# Patient Record
Sex: Male | Born: 1960 | Race: White | Hispanic: No | Marital: Single
Health system: Southern US, Community
[De-identification: ages and names within clinical notes are randomized; demographics above are authoritative.]

## PROBLEM LIST (undated history)

## (undated) DIAGNOSIS — I10 Essential (primary) hypertension: Secondary | ICD-10-CM

## (undated) DIAGNOSIS — M549 Dorsalgia, unspecified: Secondary | ICD-10-CM

## (undated) DIAGNOSIS — K219 Gastro-esophageal reflux disease without esophagitis: Secondary | ICD-10-CM

## (undated) DIAGNOSIS — F319 Bipolar disorder, unspecified: Secondary | ICD-10-CM

## (undated) DIAGNOSIS — F419 Anxiety disorder, unspecified: Secondary | ICD-10-CM

## (undated) DIAGNOSIS — K5790 Diverticulosis of intestine, part unspecified, without perforation or abscess without bleeding: Secondary | ICD-10-CM

## (undated) DIAGNOSIS — I4891 Unspecified atrial fibrillation: Secondary | ICD-10-CM

---

## 2019-02-23 ENCOUNTER — Emergency Department (HOSPITAL_COMMUNITY)
Admission: EM | Admit: 2019-02-23 | Discharge: 2019-02-23 | Disposition: A | Discharge status: Home / Self Care | Payer: No Typology Code available for payment source | Attending: Emergency Medicine | Admitting: Emergency Medicine

## 2019-02-23 ENCOUNTER — Emergency Department (HOSPITAL_COMMUNITY): Payer: No Typology Code available for payment source

## 2019-02-23 ENCOUNTER — Encounter (HOSPITAL_COMMUNITY): Payer: Self-pay | Admitting: Emergency Medicine

## 2019-02-23 ENCOUNTER — Other Ambulatory Visit: Payer: Self-pay

## 2019-02-23 DIAGNOSIS — J069 Acute upper respiratory infection, unspecified: Secondary | ICD-10-CM | POA: Insufficient documentation

## 2019-02-23 DIAGNOSIS — I10 Essential (primary) hypertension: Secondary | ICD-10-CM | POA: Insufficient documentation

## 2019-02-23 DIAGNOSIS — R0789 Other chest pain: Secondary | ICD-10-CM

## 2019-02-23 DIAGNOSIS — R079 Chest pain, unspecified: Secondary | ICD-10-CM | POA: Diagnosis present

## 2019-02-23 DIAGNOSIS — B9789 Other viral agents as the cause of diseases classified elsewhere: Secondary | ICD-10-CM

## 2019-02-23 HISTORY — DX: Unspecified atrial fibrillation: I48.91

## 2019-02-23 HISTORY — DX: Gastro-esophageal reflux disease without esophagitis: K21.9

## 2019-02-23 HISTORY — DX: Bipolar disorder, unspecified: F31.9

## 2019-02-23 HISTORY — DX: Essential (primary) hypertension: I10

## 2019-02-23 HISTORY — DX: Diverticulosis of intestine, part unspecified, without perforation or abscess without bleeding: K57.90

## 2019-02-23 HISTORY — DX: Anxiety disorder, unspecified: F41.9

## 2019-02-23 HISTORY — DX: Dorsalgia, unspecified: M54.9

## 2019-02-23 LAB — BASIC METABOLIC PANEL
Anion gap: 10 (ref 5–15)
BUN: 18 mg/dL (ref 6–20)
CO2: 23 mmol/L (ref 22–32)
Calcium: 9.4 mg/dL (ref 8.9–10.3)
Chloride: 103 mmol/L (ref 98–111)
Creatinine, Ser: 1.23 mg/dL (ref 0.61–1.24)
GFR calc Af Amer: >60 mL/min (ref >60)
GFR calc non Af Amer: >60 mL/min (ref >60)
Glucose, Bld: 122 mg/dL — ABNORMAL HIGH (ref 70–99)
Potassium: 4.1 mmol/L (ref 3.5–5.1)
Sodium: 136 mmol/L (ref 135–145)

## 2019-02-23 LAB — CBC WITH DIFFERENTIAL/PLATELET
Abs Immature Granulocytes: 0.03 10*3/uL (ref 0.00–0.07)
Basophils Absolute: 0.1 10*3/uL (ref 0.0–0.1)
Basophils Relative: 1 %
Eosinophils Absolute: 0.1 10*3/uL (ref 0.0–0.5)
Eosinophils Relative: 2 %
HCT: 46.8 % (ref 39.0–52.0)
Hemoglobin: 16 g/dL (ref 13.0–17.0)
Immature Granulocytes: 0 %
Lymphocytes Relative: 27 %
Lymphs Abs: 2.1 10*3/uL (ref 0.7–4.0)
MCH: 30.6 pg (ref 26.0–34.0)
MCHC: 34.2 g/dL (ref 30.0–36.0)
MCV: 89.5 fL (ref 80.0–100.0)
Monocytes Absolute: 0.6 10*3/uL (ref 0.1–1.0)
Monocytes Relative: 8 %
Neutro Abs: 4.7 10*3/uL (ref 1.7–7.7)
Neutrophils Relative %: 62 %
Platelets: 290 10*3/uL (ref 150–400)
RBC: 5.23 MIL/uL (ref 4.22–5.81)
RDW: 12.6 % (ref 11.5–15.5)
WBC: 7.6 10*3/uL (ref 4.0–10.5)
nRBC: 0 % (ref 0.0–0.2)

## 2019-02-23 LAB — TROPONIN I: Troponin I: <0.03 ng/mL (ref <0.03)

## 2019-02-23 NOTE — ED Triage Notes (Signed)
C/o fever of 100, chest pain and pressure "like 100# is sitting on my chest" started Saturday-- [facility] , [facility] med[facility] (zithromax and guiafenisin). Pain continued today and was told to come here.

## 2019-02-23 NOTE — Discharge Instructions (Addendum)
Person Under Monitoring Name: Scott Ellison  Location: [address] [place] [address]   Infection Prevention Recommendations for Individuals Confirmed to have, or Being Evaluated for, 2019 Novel Coronavirus (COVID-19) Infection Who Receive Care at Home  Individuals who are confirmed to have, or are being evaluated for, COVID-19 should follow the prevention steps below until a healthcare provider or local or state health department says they can return to normal activities.  Stay home except to get medical care You should restrict activities outside your home, except for getting medical care. Do not go to work, school, or public areas, and do not use public transportation or taxis.  Call ahead before visiting your doctor Before your medical appointment, call the healthcare provider and tell them that you have, or are being evaluated for, COVID-19 infection. This will help the healthcare providers office take steps to keep other people from getting infected. Ask your healthcare provider to call the local or state health department.  Monitor your symptoms Seek prompt medical attention if your illness is worsening (e.g., difficulty breathing). Before going to your medical appointment, call the healthcare provider and tell them that you have, or are being evaluated for, COVID-19 infection. Ask your healthcare provider to call the local or state health department.  Wear a facemask You should wear a facemask that covers your nose and mouth when you are in the same room with other people and when you visit a healthcare provider. People who live with or visit you should also wear a facemask while they are in the same room with you.  Separate yourself from other people in your home As much as possible, you should stay in a different room from other people in your home. Also, you should use a separate bathroom, if available.  Avoid sharing household items You should not  share dishes, drinking glasses, cups, eating utensils, towels, bedding, or other items with other people in your home. After using these items, you should wash them thoroughly with soap and water.  Cover your coughs and sneezes Cover your mouth and nose with a tissue when you cough or sneeze, or you can cough or sneeze into your sleeve. Throw used tissues in a lined trash can, and immediately wash your hands with soap and water for at least 20 seconds or use an alcohol-based hand rub.  Wash your [employer] your hands often and thoroughly with soap and water for at least 20 seconds. You can use an alcohol-based hand sanitizer if soap and water are not available and if your hands are not visibly dirty. Avoid touching your eyes, nose, and mouth with unwashed hands.   Prevention Steps for Caregivers and Household Members of Individuals Confirmed to have, or Being Evaluated for, COVID-19 Infection Being Cared for in the Home  If you live with, or provide care at home for, a person confirmed to have, or being evaluated for, COVID-19 infection please follow these guidelines to prevent infection:  Follow healthcare providers instructions Make sure that you understand and can help the patient follow any healthcare provider instructions for all care.  Provide for the patients basic needs You should help the patient with basic needs in the home and provide support for getting groceries, prescriptions, and other personal needs.  Monitor the patients symptoms If they are getting sicker, call his or her medical provider and tell them that the patient has, or is being evaluated for, COVID-19 infection. This will help the healthcare providers  office take steps to keep other people from getting infected. Ask the healthcare provider to call the local or state health department.  Limit the number of people who have contact with the patient If possible, have only one caregiver for the  patient. Other household members should stay in another home or place of residence. If this is not possible, they should stay in another room, or be separated from the patient as much as possible. Use a separate bathroom, if available. Restrict visitors who do not have an essential need to be in the home.  Keep older adults, very young children, and other sick people away from the patient Keep older adults, very young children, and those who have compromised immune systems or chronic health conditions away from the patient. This includes people with chronic heart, lung, or kidney conditions, diabetes, and cancer.  Ensure good ventilation Make sure that shared spaces in the home have good air flow, such as from an air conditioner or an opened window, weather permitting.  Wash your hands often Wash your hands often and thoroughly with soap and water for at least 20 seconds. You can use an alcohol based hand sanitizer if soap and water are not available and if your hands are not visibly dirty. Avoid touching your eyes, nose, and mouth with unwashed hands. Use disposable paper towels to dry your hands. If not available, use dedicated cloth towels and replace them when they become wet.  Wear a facemask and gloves Wear a disposable facemask at all times in the room and gloves when you touch or have contact with the patients blood, body fluids, and/or secretions or excretions, such as sweat, saliva, sputum, nasal mucus, vomit, urine, or feces.  Ensure the mask fits over your nose and mouth tightly, and do not touch it during use. Throw out disposable facemasks and gloves after using them. Do not reuse. Wash your hands immediately after removing your facemask and gloves. If your personal clothing becomes contaminated, carefully remove clothing and launder. Wash your hands after handling contaminated clothing. Place all used disposable facemasks, gloves, and other waste in a lined container before  disposing them with other household waste. Remove gloves and wash your hands immediately after handling these items.  Do not share dishes, glasses, or other household items with the patient Avoid sharing household items. You should not share dishes, drinking glasses, cups, eating utensils, towels, bedding, or other items with a patient who is confirmed to have, or being evaluated for, COVID-19 infection. After the person uses these items, you should wash them thoroughly with soap and water.  Wash laundry thoroughly Immediately remove and wash clothes or bedding that have blood, body fluids, and/or secretions or excretions, such as sweat, saliva, sputum, nasal mucus, vomit, urine, or feces, on them. Wear gloves when handling laundry from the patient. Read and follow directions on labels of laundry or clothing items and detergent. In general, wash and dry with the warmest temperatures recommended on the label.  Clean all areas the individual has used often Clean all touchable surfaces, such as counters, tabletops, doorknobs, bathroom fixtures, toilets, phones, keyboards, tablets, and bedside tables, every day. Also, clean any surfaces that may have blood, body fluids, and/or secretions or excretions on them. Wear gloves when cleaning surfaces the patient has come in contact with. Use a diluted bleach solution (e.g., dilute bleach with 1 part bleach and 10 parts water) or a household disinfectant with a label that says EPA-registered for coronaviruses. To make a  bleach solution at home, add 1 tablespoon of bleach to 1 quart (4 cups) of water. For a larger supply, add  cup of bleach to 1 gallon (16 cups) of water. Read labels of cleaning products and follow recommendations provided on product labels. Labels contain instructions for safe and effective use of the cleaning product including precautions you should take when applying the product, such as wearing gloves or eye protection and making sure you  have good ventilation during use of the product. Remove gloves and wash hands immediately after cleaning.  Monitor yourself for signs and symptoms of illness Caregivers and household members are considered close contacts, should monitor their health, and will be asked to limit movement outside of the home to the extent possible. Follow the monitoring steps for close contacts listed on the symptom monitoring form.   ? If you have additional questions, contact your local health department or call the epidemiologist on call at [phone] (available 24/7). ? This guidance is subject to change. For the most up-to-date guidance from [facility], please refer to their website: [email or web address]

## 2019-02-23 NOTE — ED Provider Notes (Signed)
[facility] EMERGENCY DEPARTMENT Provider Note   CSN: [id number] Arrival date & time: 02/23/19  1005    History   Chief Complaint Chief Complaint  Patient presents with  . Chest Pain  . Cough    HPI Scott Ellison is a 58 y.o. male with past medical history of anxiety, hypertension, GERD, chronic back pain, atrial fibrillation, presenting to the emergency department with complaint of chest pressure and tightness that began this weekend.  Symptoms are constant, nonradiating, described as a "100lb weight" sitting on his chest.  He reports feeling fatigued and winded with exertion however is not short of breath at rest and is having no difficulty breathing.  He has a dry cough. Last week symptoms initially began with scratchy and sore throat on Wednesday.  He has had associated intermittent symptoms consisting of low-grade fever of T-max 100 F, body aches, nausea, diarrhea, loss of appetite.  [facility] provider at [facility] prescribed guaifenesin and a Z-Pak last week, however spoke with them today for follow-up and he described the chest pressure, therefore was recommended report to the ED for further evaluation.  Patient's girlfriend works in the Dealer at [employer].  No cardiac history.  No history of immunocompromise.     The history is provided by the patient.    Past Medical History:  Diagnosis Date  . Anxiety   . Atrial fibrillation (HCC)   . Back pain   . Bipolar 1 disorder (HCC)   . Diverticulosis   . GERD (gastroesophageal reflux disease)   . Hypertension     There are no active problems to display for this patient.   History reviewed. No pertinent surgical history.      Home Medications    Prior to Admission medications   Not on File    Family History No family history on file.  Social History Social History   Tobacco Use  . Smoking status: Not on file  Substance Use Topics  . Alcohol use: Not on file  . Drug use: Not on file      Allergies   Patient has no allergy information on record.   Review of Systems Review of Systems  Constitutional: Positive for appetite change and fever.  HENT: Positive for sore throat.   Respiratory: Positive for cough and chest tightness.   Cardiovascular: Negative for palpitations.  Gastrointestinal: Positive for diarrhea and nausea. Negative for abdominal pain.  Musculoskeletal: Positive for myalgias (generalized).  Allergic/Immunologic: Negative for immunocompromised state.  All other systems reviewed and are negative.    Physical Exam Updated Vital Signs BP (!) 149/97   Pulse 64   Temp 98.3 F (36.8 C) (Oral)   Resp 14   Ht  (1.651 m)   Wt 90.7 kg   SpO2 99%   BMI 33.28 kg/m   Physical Exam Vitals signs and nursing note reviewed.  Constitutional:      General: He is not in acute distress.    Appearance: He is well-developed. He is obese. He is not ill-appearing.  HENT:     Head: Normocephalic and atraumatic.     Right Ear: Tympanic membrane and ear canal normal.     Left Ear: Tympanic membrane and ear canal normal.     Mouth/Throat:     Mouth: Mucous membranes are moist.     Pharynx: Oropharynx is clear. No oropharyngeal exudate or posterior oropharyngeal erythema.  Eyes:     Conjunctiva/sclera: Conjunctivae normal.  Neck:  Musculoskeletal: Normal range of motion and neck supple. No neck rigidity or muscular tenderness.  Cardiovascular:     Rate and Rhythm: Normal rate and regular rhythm.     Heart sounds: Normal heart sounds.  Pulmonary:     Effort: Pulmonary effort is normal. No respiratory distress.     Breath sounds: Normal breath sounds.  Chest:     Chest wall: No tenderness.  Abdominal:     General: Bowel sounds are normal.     Palpations: Abdomen is soft.     Tenderness: There is no abdominal tenderness. There is no guarding or rebound.  Lymphadenopathy:     Cervical: No cervical adenopathy.  Skin:    General: Skin is warm.   Neurological:     Mental Status: He is alert.  Psychiatric:        Behavior: Behavior normal.      ED Treatments / Results  Labs (all labs ordered are listed, but only abnormal results are displayed) Labs Reviewed  BASIC METABOLIC PANEL - Abnormal; Notable for the following components:      Result Value   Glucose, Bld 122 (*)    All other components within normal limits  CBC WITH DIFFERENTIAL/PLATELET  TROPONIN I    EKG EKG Interpretation  Date/Time:  Monday February 23 2019 10:21:13 EDT Ventricular Rate:  78 PR Interval:    QRS Duration: 99 QT Interval:  394 QTC Calculation: 449 R Axis:   5 Text Interpretation:  Sinus rhythm No old tracing to compare No apparent ischemia Confirmed by Shaune Pollack [phone]) on 02/23/2019 10:52:27 AM   Radiology Dg Chest Port 1 View  Result Date: 02/23/2019 CLINICAL DATA:  Fever, chest pain and pressure. EXAM: PORTABLE CHEST 1 VIEW COMPARISON:  None. FINDINGS: Lung volumes are low but the lungs are clear. Heart size is normal. No pneumothorax or pleural fluid. No acute or focal bony abnormality. IMPRESSION: No acute finding in a low volume chest. Electronically Signed   By: Drusilla Kanner M.D.   On: 02/23/2019 10:56    Procedures Procedures (including critical care time)  Medications Ordered in ED Medications - No data to display   Initial Impression / Assessment and Plan / ED Course  I have reviewed the triage vital signs and the nursing notes.  Pertinent labs & imaging results that were available during my care of the patient were reviewed by me and considered in my medical decision making (see chart for details).    Scott Ellison was evaluated in Emergency Department on 02/23/2019 for the symptoms described in the history of present illness. He was evaluated in the context of the global COVID-19 pandemic, which necessitated consideration that the patient might be at risk for infection with the SARS-CoV-2 virus that causes COVID-19.  Institutional protocols and algorithms that pertain to the evaluation of patients at risk for COVID-19 are in a state of rapid change based on information released by regulatory bodies including the CDC and federal and state organizations. These policies and algorithms were followed during the patient's care in the ED.     Patients symptoms are consistent with URI, likely viral etiology. Chest pressure is suspected to be associated with current viral illness. Doubt ACS, sx are atypical, constant for multiple days. Trop is neg, EKG is nonischemic. Low heart score. Afebrile, tolerating secretions.  Lungs clear to auscultation bilaterally. CXR negative for acute infiltrate.  In the setting of the current COVID-19 pandemic, patient instructed to self-isolate at home until at least 7  days after symptoms began and then 72 hours after cough improves and is afebrile, without the assistance of antipyretics.  Pt will be discharged with symptomatic treatment.  Verbalizes understanding and is agreeable with plan. Pt is hemodynamically stable & in NAD prior to dc.  Discussed patient with Dr. Erma Heritage, who agrees with workup and care plan.  Discussed results, findings, treatment and follow up. Patient advised of return precautions. Patient verbalized understanding and agreed with plan.   Final Clinical Impressions(s) / ED Diagnoses   Final diagnoses:  Viral URI with cough  Chest pressure    ED Discharge Orders    None       , [place] N, PA-C 02/23/19 1304    Shaune Pollack, MD 02/23/19 1944

## 2020-03-16 IMAGING — DX PORTABLE CHEST - 1 VIEW
1 series · 1 of 1 positions shown · non-contrast
Comparison: None.

CLINICAL DATA: Fever, chest pain and pressure.

EXAM:
PORTABLE CHEST 1 VIEW

[chest ap]
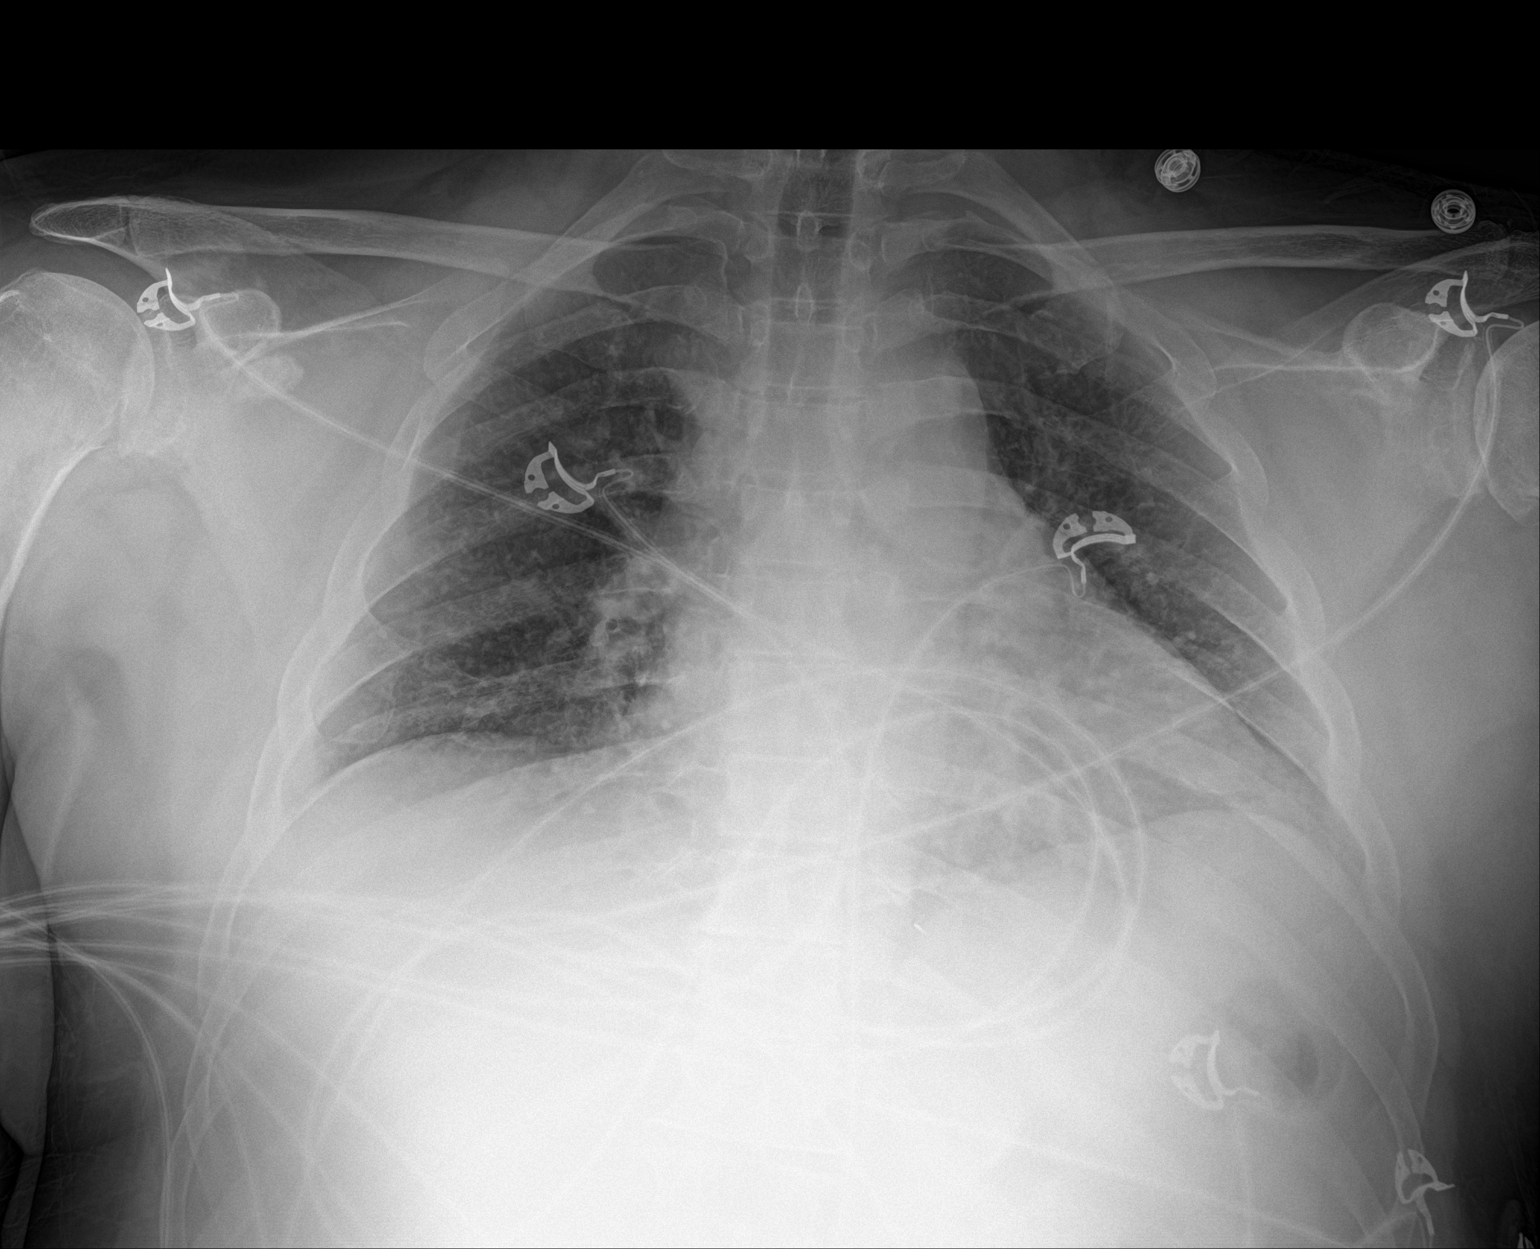

[1 of 1 positions shown; findings below may reference images not displayed]

FINDINGS: Lung volumes are low but the lungs are clear. Heart size is normal.
No pneumothorax or pleural fluid. No acute or focal bony
abnormality.
IMPRESSION: No acute finding in a low volume chest.
